# Patient Record
Sex: Male | Born: 1965 | Race: Black or African American | Hispanic: No | Marital: Single | Smoking: Never smoker
Health system: Southern US, Community
[De-identification: ages and names within clinical notes are randomized; demographics above are authoritative.]

## PROBLEM LIST (undated history)

## (undated) DIAGNOSIS — I10 Essential (primary) hypertension: Secondary | ICD-10-CM

---

## 2020-01-06 ENCOUNTER — Emergency Department: Payer: Self-pay

## 2020-01-06 ENCOUNTER — Emergency Department
Admission: EM | Admit: 2020-01-06 | Discharge: 2020-01-06 | Disposition: A | Discharge status: Home / Self Care | Payer: Self-pay | Attending: Emergency Medicine | Admitting: Emergency Medicine

## 2020-01-06 DIAGNOSIS — I160 Hypertensive urgency: Secondary | ICD-10-CM

## 2020-01-06 DIAGNOSIS — Z760 Encounter for issue of repeat prescription: Secondary | ICD-10-CM | POA: Insufficient documentation

## 2020-01-06 DIAGNOSIS — I10 Essential (primary) hypertension: Secondary | ICD-10-CM | POA: Insufficient documentation

## 2020-01-06 DIAGNOSIS — R001 Bradycardia, unspecified: Secondary | ICD-10-CM | POA: Insufficient documentation

## 2020-01-06 DIAGNOSIS — E876 Hypokalemia: Secondary | ICD-10-CM | POA: Insufficient documentation

## 2020-01-06 DIAGNOSIS — R0789 Other chest pain: Secondary | ICD-10-CM | POA: Insufficient documentation

## 2020-01-06 DIAGNOSIS — R519 Headache, unspecified: Secondary | ICD-10-CM | POA: Insufficient documentation

## 2020-01-06 HISTORY — DX: Essential (primary) hypertension: I10

## 2020-01-06 LAB — ECG 12-LEAD
Atrial Rate: 59 {beats}/min
P Axis: 29 degrees
P-R Interval: 208 ms
Q-T Interval: 398 ms
QRS Duration: 118 ms
QTC Calculation (Bezet): 394 ms
R Axis: -26 degrees
T Axis: 4 degrees
Ventricular Rate: 59 {beats}/min

## 2020-01-06 LAB — COMPREHENSIVE METABOLIC PANEL
ALT: 27 U/L (ref 0–55)
AST (SGOT): 20 U/L (ref 5–34)
Albumin/Globulin Ratio: 1.2 (ref 0.9–2.2)
Albumin: 3.7 g/dL (ref 3.5–5.0)
Alkaline Phosphatase: 80 U/L (ref 38–106)
Anion Gap: 9 (ref 5.0–15.0)
BUN: 10.9 mg/dL (ref 9.0–28.0)
Bilirubin, Total: 0.4 mg/dL (ref 0.2–1.2)
CO2: 28 mEq/L (ref 22–29)
Calcium: 9.2 mg/dL (ref 8.5–10.5)
Chloride: 105 mEq/L (ref 100–111)
Creatinine: 1.3 mg/dL (ref 0.7–1.3)
Globulin: 3 g/dL (ref 2.0–3.6)
Glucose: 113 mg/dL — ABNORMAL HIGH (ref 70–100)
Potassium: 3.4 mEq/L — ABNORMAL LOW (ref 3.5–5.1)
Protein, Total: 6.7 g/dL (ref 6.0–8.3)
Sodium: 142 mEq/L (ref 136–145)

## 2020-01-06 LAB — CBC AND DIFFERENTIAL
Absolute NRBC: 0 10*3/uL (ref 0.00–0.00)
Basophils Absolute Automated: 0.02 10*3/uL (ref 0.00–0.08)
Basophils Automated: 0.4 %
Eosinophils Absolute Automated: 0.17 10*3/uL (ref 0.00–0.44)
Eosinophils Automated: 3.7 %
Hematocrit: 42.6 % (ref 37.6–49.6)
Hgb: 13.9 g/dL (ref 12.5–17.1)
Immature Granulocytes Absolute: 0.01 10*3/uL (ref 0.00–0.07)
Immature Granulocytes: 0.2 %
Lymphocytes Absolute Automated: 1.6 10*3/uL (ref 0.42–3.22)
Lymphocytes Automated: 34.7 %
MCH: 24.8 pg — ABNORMAL LOW (ref 25.1–33.5)
MCHC: 32.6 g/dL (ref 31.5–35.8)
MCV: 75.9 fL — ABNORMAL LOW (ref 78.0–96.0)
MPV: 9.1 fL (ref 8.9–12.5)
Monocytes Absolute Automated: 0.25 10*3/uL (ref 0.21–0.85)
Monocytes: 5.4 %
Neutrophils Absolute: 2.56 10*3/uL (ref 1.10–6.33)
Neutrophils: 55.6 %
Nucleated RBC: 0 /100 WBC (ref 0.0–0.0)
Platelets: 216 10*3/uL (ref 142–346)
RBC: 5.61 10*6/uL (ref 4.20–5.90)
RDW: 15 % (ref 11–15)
WBC: 4.61 10*3/uL (ref 3.10–9.50)

## 2020-01-06 LAB — TSH: TSH: 1.51 u[IU]/mL (ref 0.35–4.94)

## 2020-01-06 LAB — GFR: EGFR: >60

## 2020-01-06 LAB — MAGNESIUM: Magnesium: 2.1 mg/dL (ref 1.6–2.6)

## 2020-01-06 LAB — TROPONIN I: Troponin I: <0.01 ng/mL (ref 0.00–0.05)

## 2020-01-06 MED ORDER — POTASSIUM CHLORIDE CRYS ER 20 MEQ PO TBCR
40.00 meq | EXTENDED_RELEASE_TABLET | Freq: Once | ORAL | Status: AC
Start: 2020-01-06 — End: 2020-01-06
  Administered 2020-01-06: 13:00:00 40 meq via ORAL
  Filled 2020-01-06: qty 2

## 2020-01-06 MED ORDER — HYDROCHLOROTHIAZIDE 25 MG PO TABS
25.00 mg | ORAL_TABLET | Freq: Every day | ORAL | 0 refills | Status: AC
Start: 2020-01-06 — End: 2020-02-05

## 2020-01-06 MED ORDER — HYDRALAZINE HCL 20 MG/ML IJ SOLN
5.00 mg | Freq: Once | INTRAMUSCULAR | Status: AC
Start: 2020-01-06 — End: 2020-01-06
  Administered 2020-01-06: 12:00:00 5 mg via INTRAVENOUS
  Filled 2020-01-06: qty 1

## 2020-01-06 NOTE — Discharge Instructions (Signed)
Chest Pain of Unclear Etiology    You have been seen for chest pain. The cause of your pain is not yet known.    Your doctor has learned about your medical history, examined you, and checked any tests that were done. Still, it is not clear why you are having pain. The doctor thinks there is only a small chance that your pain is caused by a health problem that could lead to serious harm or death. Later, your primary care doctor might do more tests or check you again.    Sometimes chest pain is caused by a health problem that can lead to death, like a:   Heart attack.   Injury to the large blood vessel in your body (aorta).   Blood clot in the lung.   Collapsed lung.     It is not likely that your pain is caused by a health problem that could lead to death if:    Your chest pain lasts only a few seconds at a time   You are not short of breath, nauseated (sick to your stomach), sweaty, or lightheaded   Your pain gets worse when you twist or bend   Your pain improves with exercise or hard work.    Chest pain is serious. It is very important that you follow up with your regular doctor.    Return here or go to the nearest Emergency Department immediately if:   Your pain makes you short of breath, nauseated (sick to your stomach), or sweaty.   Your pain gets worse when you walk, go up stairs, or exert yourself.   You feel weak, lightheaded, or faint.   It hurts to breathe.   Your leg swells.   Your pain or symptoms get worse    You have new symptoms or concerns.    If you can't follow up with your doctor, or if at any time you feel you need to be rechecked or seen again, come back here or go to the nearest emergency department.               Hypokalemia    During your visit here today, your potassium was found to be too low.    Potassium is an "electrolyte." It is found in your cells and in your blood stream and is very important for your body's normal functioning.    Potassium can be low for many  reasons. The most common causes are:   Taking diuretics (water pills) like furosemide (Lasix) and bumetanide (Bumex).   A lot of vomiting and/or diarrhea.   Poor nutrition.    Some symptoms when potassium is too low are fatigue (feeling tired), weakness and muscle cramping. If the potassium level gets dangerously low, abnormal electrical rhythms can develop in the heart.   You will need to take extra potassium for the next few days.    YOU SHOULD SEEK MEDICAL ATTENTION IMMEDIATELY, EITHER HERE OR AT THE NEAREST EMERGENCY DEPARTMENT, IF ANY OF THE FOLLOWING OCCURS:   You feel weak or fatigued (tired) despite taking the potassium pills (liquid) prescribed.   You keep vomiting and are unable to keep the extra potassium down.               Potassium-Rich Diet (Edu)    Your doctor wanted you to have the following information about a potassium-rich diet...     You are getting this information because the level of potassium in your blood is low.  Low potassium can be caused by:    Not eating enough potassium. This is a nutrition problem.   Taking water pills (diuretics) for high blood pressure or heart failure.   Your kidneys removing too much potassium from your blood.    If you have high blood pressure, having enough potassium in your blood can help keep it under control. A high potassium diet might not be healthy if you have kidney problems. This is because your kidneys remove extra potassium from your blood, and too much potassium might give them problems.    The normal level of potassium in your blood is 3.5 to 4.5 mEq/L. Anything higher or lower is not normal and can cause problems with your nerves, muscles, and heart.     Some of the symptoms of low potassium are weakness, muscle aches, and cramps. A very high or very low potassium level can kill you. This is because without the right amount of potassium in your blood, your heart can stop beating. It is therefore very important to keep your  potassium level in the right range. You can do this by adding potassium to your diet.    Here are some tips to help you eat a diet high in potassium:      Look at food labels and see how much potassium is in the food. If you have healthy kidneys but high blood pressure, you should try to eat 4.7 grams of potassium every day.     Some fruits DO contain high potassium. Some of these fruits are: prunes, mangoes, kiwis, oranges, bananas, and pomegranates. Grapefruit juice also has a lot of potassium. Some of the vegetables that are high in potassium are: artichokes, squash, Brussels sprouts, spinach, and tomatoes. Check the serving size of the fruits and vegetables you eat.     Red meat (steak, lamb, and pork) often has more potassium than lean meat (chicken and [place]). Most seafood also has high levels of potassium. Read the labels of your meat before you cook it.     Yogurt is a dairy product with a lot of potassium. It also fits well into a balanced diet.    Make sure you are also taking all your medications.    If you are on dialysis, make sure you are keeping all of your chair time. This is very important in keeping your potassium level normal.    If you have trouble eating a high-potassium diet, tell your doctor. Your doctor will know a dietician or a nutrition specialist who can help you. In some cases, your doctor might give you potassium pills.               Medication Refill    You have been given a refill of one or more of your regular medicines.    In emergency situations, the doctor can give you a temporary refill for the medicines you need. However, you need to follow up with your regular doctor for future refills.    Some medicines can cause significant symptoms when stopped suddenly. Therefore, it is important to make plans for getting refills before you run out of medicines.    Return here or go to the nearest Emergency Department if:   You need an emergency refill on your medications  and you cannot get in touch with your doctor.        Headache    You have been treated for a headache.    Headaches are very common. Most  of the time they are benign (not harmful). Some headaches can be very serious. Your headache appears to be benign. The doctor feels it is OK for you to go home.    If you continue to have headaches, or if this headache does not go away over the next few days, you should be evaluated by your regular doctor or a neurologist. Keeping a "headache diary" may help your doctor learn the cause of your headaches.    When you get a headache, write down:   What happens before your headache starts - Where you were, what you were doing, if you ate anything, and so on.   Where your pain is.   What kind-of pain you have - Sharp, aching, throbbing, burning.    What helps your headache get better.    Take your headache medication as directed. This is very important if your doctor has placed you on a daily medication to prevent headaches.    Return here or go to the nearest Emergency Department immediately if:   Your headache gets worse.   You have a severe headache that starts suddenly.   Your head pain is different from your normal headache.   You have a fever (temperature higher than 100.57F / 38C), especially with a stiff neck.   You feel numbness, tingling, or weakness in your arms or legs.   You pass out.   You have problems with your vision.   You vomit (throw up) and have trouble taking medication or keeping it down.    If you can't follow up with your doctor, or if at any time you feel you need to be rechecked or seen again, come back here or go to the nearest emergency department.             Hypertension, Exacerbation    You have been previously diagnosed with hypertension. Hypertension means high blood pressure that happens every day. To be diagnosed with hypertension, the blood pressure readings must be abnormally high at least 3 different times. There are causes  for high blood pressure that doctors can find. These include being overweight or having a kidney or hormone problem. This is called secondary hypertension. When a doctor does not know the cause, it is called "essential hypertension." Both types of hypertension may require medicine to lower the blood pressure. Some people with high blood pressure will improve if they limit the sodium (salt) in their diets.    High blood pressure often has no symptoms. However, it can cause headaches or vision problems. In rare cases, very high blood pressure can cause seizures (fits). It is important to diagnose and treat hypertension even if there are no symptoms. This is because high blood pressure can cause damage to organs like the heart and kidneys. This damage can be permanent (not go away). That is why it is very important to take all medicines prescribed for your condition.    See your doctor in the next 24 hours.     YOU SHOULD SEEK MEDICAL ATTENTION IMMEDIATELY, EITHER HERE OR AT THE NEAREST EMERGENCY DEPARTMENT IF ANY OF THE FOLLOWING OCCUR:   Your chest hurts or you have trouble breathing.   You have a severe headache or have trouble seeing.   You have seizures.   You vomit (throw up) repeatedly or get more ill.

## 2020-01-06 NOTE — ED Provider Notes (Signed)
History     Chief Complaint   Patient presents with   . Hypertension     HPI     DR. DARWIN Veroncia Jezek  is the primary attending for this patient and performed the HPI, PE, and medical decision making for this patient.    Patient Name: Jeremiah Fisher, Jeremiah Fisher, 54 y.o., male    History obtained by: Patient    Chief Complaint: elevated bp, cp, medication refill  Onset: yesterday  Context: PMH htn. Pt normally takes hctz 25mg  po q day but non x4-5 days b/c travels throughout the [place] for work. Transient mild ha and cp but none today. Pt refilled hctz 25 mg po yesterday, has enough x30 days. Pt coming in today b/c bp still elevated but notes it typically does take a few days for bp to normalize after missing doses in the psat. Currently asymptomatic.   Location: no current c/o pain  Severity: 0/10  Quality: dull  Timing: intermittent  Progression:  resolved  Radiation:  none  Alleviating Factors: restarting hctz yesterday  Ineffective Treatments: None  Exacerbating Factors: missing hctz doses  Associated Signs and Symptoms: Denies n/v/f/c/sob/palpitations/neck pain/rash/focal deficits/abdominal pain/urinary or bowel changes/cough/known sick contacts/recent travel/trauma/      HPI: elevated bp, cp, medication refill yesterday. PMH htn. Pt normally takes hctz 25mg  po q day but non x4-5 days b/c travels [place] the country for work. Transient mild ha and cp but none today. Pt refilled hctz 25 mg po yesterday, has enough x30 days. Pt coming in today b/c bp still elevated but notes it typically does take a few days for bp to normalize after missing doses in the psat. Currently asymptomatic.  Denies n/v/f/c/sob/palpitations/neck pain/rash/focal deficits/abdominal pain/urinary or bowel changes/cough/known sick contacts/recent travel/trauma/      Past Medical History:   Diagnosis Date   . Hypertension        History reviewed. No pertinent surgical history.    History reviewed. No pertinent family history.    Social  Social  History     Tobacco Use   . Smoking status: Never Smoker   . Smokeless tobacco: Never Used   Substance Use Topics   . Alcohol use: Yes   . Drug use: Not on file       .     No Known Allergies    Home Medications     Med List Status: In Progress Set By: Shirlee Limerick, RN at 01/06/2020 11:28 AM        No Medications           Review of Systems   Constitutional: Negative for chills and fever.   HENT: Negative for trouble swallowing and voice change.    Eyes: Negative for pain and visual disturbance.   Respiratory: Negative for chest tightness and shortness of breath.    Cardiovascular: Positive for chest pain. Negative for palpitations.   Gastrointestinal: Negative for abdominal pain, nausea and vomiting.   Genitourinary: Negative for dysuria and flank pain.   Musculoskeletal: Negative for arthralgias, back pain, neck pain and neck stiffness.   Skin: Negative for color change and rash.   Neurological: Positive for headaches. Negative for speech difficulty.   All other systems reviewed and are negative.      Physical Exam    BP: (!) 191/102, Heart Rate: 65, Temp: 98.3 F (36.8 C), Resp Rate: 18, SpO2: 100 %, Weight: 122.5 kg    Physical Exam  Vitals and nursing note reviewed.   Constitutional:  General: He is not in acute distress.     Appearance: He is well-developed.      Comments: Elevated BMI   HENT:      Head: Normocephalic.      Right Ear: External ear normal.      Left Ear: External ear normal.   Eyes:      General:         Right eye: No discharge.         Left eye: No discharge.      Pupils: Pupils are equal, round, and reactive to light.   Neck:      Trachea: No tracheal deviation.   Cardiovascular:      Rate and Rhythm: Regular rhythm. Bradycardia present.      Heart sounds: Normal heart sounds.   Pulmonary:      Effort: Pulmonary effort is normal. No respiratory distress.      Breath sounds: Normal breath sounds.   Abdominal:      Palpations: Abdomen is soft.      Tenderness: There is no abdominal  tenderness.   Musculoskeletal:         General: No tenderness. Normal range of motion.      Cervical back: Normal range of motion.   Skin:     General: Skin is warm.      Coloration: Skin is not pale.      Findings: No rash.   Neurological:      General: No focal deficit present.      Mental Status: He is alert and oriented to person, place, and time.      GCS: GCS eye subscore is 4. GCS verbal subscore is 5. GCS motor subscore is 6.      Cranial Nerves: Cranial nerves are intact. No cranial nerve deficit.      Sensory: Sensation is intact.      Motor: Motor function is intact.           MDM and ED Course     ED Medication Orders (From admission, onward)    Start Ordered     Status Ordering Provider    01/06/20 1245 01/06/20 1234  potassium chloride (KLOR-CON) CR tablet 40 mEq  Once     Route: Oral  Ordered Dose: 40 mEq     Last MAR action: Given Giannina Bartolome, DARWIN-DEAN TABIOS    01/06/20 1145 01/06/20 1132  hydrALAZINE (APRESOLINE) injection 5 mg  Once     Route: Intravenous  Ordered Dose: 5 mg     Last MAR action: Given Jeryl Wilbourn, DARWIN-DEAN TABIOS             MDM  Number of Diagnoses or Management Options  Acute nonintractable headache, unspecified headache type  Atypical chest pain  Hypertension, unspecified type  Hypertensive urgency  Hypokalemia  Medication refill  Sinus bradycardia  Diagnosis management comments: 11:30- Pt prefers to hold on pain medications at this time, no acute distress. Pt currently asymptomatic. No c/o cp or ha.     Refilled pt's home dose of hctz 25mg  PO qday, 30 tabs.    Pt to f/u with pcm.    Asymptomatic during er stay.    Possibilities causing patient symptoms were discussed with the patient as is work up and evaluation in emergency department. Disposition plan was discussed, and pt's questions were answered. Instructed pt to return if sx worsen, persist, or if new sx develop. Pt understands and agrees with plan. Pt doing well upon discharge, aaox3, NAD,  ambulates.         Amount  and/or Complexity of Data Reviewed  Clinical lab tests: reviewed and ordered  Tests in the radiology section of CPT: ordered and reviewed  Tests in the medicine section of CPT: ordered and reviewed  Review and summarize past medical records: yes  Independent visualization of images, tracings, or specimens: yes    Patient Progress  Patient progress: improved             Results     Procedure Component Value Units Date/Time    TSH [[id number]] Collected: 01/06/20 1138    Specimen: Blood Updated: 01/06/20 1253     TSH 1.51 uIU/mL     Comprehensive metabolic panel [[id number]]  (Abnormal) Collected: 01/06/20 1138    Specimen: Blood Updated: 01/06/20 1233     Glucose 113 mg/dL      BUN 78.2 mg/dL      Creatinine 1.3 mg/dL      Sodium 956 mEq/L      Potassium 3.4 mEq/L      Chloride 105 mEq/L      CO2 28 mEq/L      Calcium 9.2 mg/dL      Protein, Total 6.7 g/dL      Albumin 3.7 g/dL      AST (SGOT) 20 U/L      ALT 27 U/L      Alkaline Phosphatase 80 U/L      Bilirubin, Total 0.4 mg/dL      Globulin 3.0 g/dL      Albumin/Globulin Ratio 1.2     Anion Gap 9.0    Magnesium [[id number]] Collected: 01/06/20 1138    Specimen: Blood Updated: 01/06/20 1233     Magnesium 2.1 mg/dL     GFR [[id number]] Collected: 01/06/20 1138     Updated: 01/06/20 1233     EGFR >60.0    Troponin I [[id number]] Collected: 01/06/20 1138     Updated: 01/06/20 1222     Troponin I <0.01 ng/mL     CBC and differential [[id number]]  (Abnormal) Collected: 01/06/20 1138    Specimen: Blood Updated: 01/06/20 1152     WBC 4.61 x10 3/uL      Hgb 13.9 g/dL      Hematocrit 03.4 %      Platelets 216 x10 3/uL      RBC 5.61 x10 6/uL      MCV 75.9 fL      MCH 24.8 pg      MCHC 32.6 g/dL      RDW 15 %      MPV 9.1 fL      Neutrophils 55.6 %      Lymphocytes Automated 34.7 %      Monocytes 5.4 %      Eosinophils Automated 3.7 %      Basophils Automated 0.4 %      Immature Granulocytes 0.2 %      Nucleated RBC 0.0 /100 WBC      Neutrophils Absolute 2.56 x10 3/uL       Lymphocytes Absolute Automated 1.60 x10 3/uL      Monocytes Absolute Automated 0.25 x10 3/uL      Eosinophils Absolute Automated 0.17 x10 3/uL      Basophils Absolute Automated 0.02 x10 3/uL      Immature Granulocytes Absolute 0.01 x10 3/uL      Absolute NRBC 0.00 x10 3/uL         Radiology Results (24 Hour)  Procedure Component Value Units Date/Time    XR Chest  AP Portable [[id number]] Collected: 01/06/20 1152    Order Status: Completed Updated: 01/06/20 1155    Narrative:      CLINICAL HISTORY: Chest pain    COMPARISON: None    FINDINGS: Single AP portable view of the chest was exposed.  Heart size  and contour are normal.  Lungs are clear with normal pulmonary  vascularity.  No pleural effusion, hilar or mediastinal prominence is  evident. No airspace consolidation or pneumothorax is seen. The  visualized bones are unremarkable.      Impression:        No active disease noted in the chest. No pulmonary infiltrates are  identified.    Colonel Bald, MD   01/06/2020 11:53 AM            EKG Interpretation  EKG interpreted independently by Dr. Vic Ripper    Rate: Bradycardic, 59 bpm  Rhythm: sinus bradycardia  Axis: Normal  ST-T Segments: nonspec st-t changes  Conduction: No blocks  Impression: Non-specific EKG, sinus bradycardia, no evidence of acute infarct    No previous EKG on record.      When I was within 6 feet of this patient I donned the following PPE:  Surgical Mask Yes, Gloves Yes, Gown No  ; Goggles Yes; Face Shield No  , 46M 6000 Respirator No  ; N95 No  .  The patient was wearing a mask during my evaluation Yes.      This note was generated by the Epic EMR system/Dragon speech recognition and may contain inherent errors or omissions not intended by the user. Grammatical errors, random word insertions, deletions, pronoun errors and incomplete sentences are occasional consequences of this technology due to software limitations. Not all errors are caught or corrected. If there are questions or  concerns about the content of this note or information contained within the body of this dictation they should be addressed directly with the author for clarification.    Heart Score      Value   History  0   EKG  1   Risk Factors  1   Total (with age)  3   Onset of pain (time of START of last episode of chest pain)?  >6 hrs ago          Procedures    Clinical Impression & Disposition     Clinical Impression  Final diagnoses:   Atypical chest pain   Hypertensive urgency   Medication refill   Hypertension, unspecified type   Acute nonintractable headache, unspecified headache type   Sinus bradycardia   Hypokalemia        ED Disposition     ED Disposition Condition Date/Time Comment    Discharge  Fri Jan 06, 2020 12:35 PM Earnest Conroy discharge to home/self care.    Condition at disposition: Stable           Discharge Medication List as of 01/06/2020 12:35 PM      START taking these medications    Details   hydroCHLOROthiazide (HYDRODIURIL) 25 MG tablet Take 1 tablet (25 mg total) by mouth daily, Starting Fri 01/06/2020, Until Sun 02/05/2020, [facility]                       Guadalupe Maple, MD  01/06/20 1258
